# Patient Record
Sex: Female | Born: 1935 | Race: White | Hispanic: No | Smoking: Never smoker
Health system: Southern US, Community
[De-identification: ages and names within clinical notes are randomized; demographics above are authoritative.]

## PROBLEM LIST (undated history)

## (undated) DIAGNOSIS — I10 Essential (primary) hypertension: Secondary | ICD-10-CM

## (undated) HISTORY — PX: PACEMAKER INSERTION: SHX728

---

## 2014-07-31 ENCOUNTER — Encounter (HOSPITAL_BASED_OUTPATIENT_CLINIC_OR_DEPARTMENT_OTHER): Payer: Self-pay | Admitting: *Deleted

## 2014-07-31 ENCOUNTER — Emergency Department (HOSPITAL_BASED_OUTPATIENT_CLINIC_OR_DEPARTMENT_OTHER): Payer: Medicare Other

## 2014-07-31 ENCOUNTER — Emergency Department (HOSPITAL_BASED_OUTPATIENT_CLINIC_OR_DEPARTMENT_OTHER)
Admission: EM | Admit: 2014-07-31 | Discharge: 2014-07-31 | Disposition: A | Discharge status: Other Acute Inpatient Hospital | Payer: Medicare Other | Attending: Emergency Medicine | Admitting: Emergency Medicine

## 2014-07-31 DIAGNOSIS — I10 Essential (primary) hypertension: Secondary | ICD-10-CM | POA: Diagnosis not present

## 2014-07-31 DIAGNOSIS — R4182 Altered mental status, unspecified: Secondary | ICD-10-CM | POA: Diagnosis not present

## 2014-07-31 DIAGNOSIS — N390 Urinary tract infection, site not specified: Secondary | ICD-10-CM | POA: Insufficient documentation

## 2014-07-31 DIAGNOSIS — Z950 Presence of cardiac pacemaker: Secondary | ICD-10-CM | POA: Diagnosis not present

## 2014-07-31 DIAGNOSIS — R531 Weakness: Secondary | ICD-10-CM | POA: Diagnosis present

## 2014-07-31 DIAGNOSIS — Z880 Allergy status to penicillin: Secondary | ICD-10-CM | POA: Insufficient documentation

## 2014-07-31 HISTORY — DX: Essential (primary) hypertension: I10

## 2014-07-31 LAB — CBC WITH DIFFERENTIAL/PLATELET
BASOS ABS: 0 10*3/uL (ref 0.0–0.1)
BLASTS: 0 %
Band Neutrophils: 4 % (ref 0–10)
Basophils Relative: 0 % (ref 0–1)
EOS ABS: 0 10*3/uL (ref 0.0–0.7)
EOS PCT: 0 % (ref 0–5)
HCT: 40.1 % (ref 36.0–46.0)
Hemoglobin: 13.5 g/dL (ref 12.0–15.0)
LYMPHS PCT: 10 % — AB (ref 12–46)
Lymphs Abs: 1.6 10*3/uL (ref 0.7–4.0)
MCH: 27.6 pg (ref 26.0–34.0)
MCHC: 33.7 g/dL (ref 30.0–36.0)
MCV: 81.8 fL (ref 78.0–100.0)
METAMYELOCYTES PCT: 0 %
MONO ABS: 1.4 10*3/uL — AB (ref 0.1–1.0)
Monocytes Relative: 9 % (ref 3–12)
Myelocytes: 0 %
NEUTROS ABS: 12.7 10*3/uL — AB (ref 1.7–7.7)
Neutrophils Relative %: 77 % (ref 43–77)
PLATELETS: 335 10*3/uL (ref 150–400)
Promyelocytes Absolute: 0 %
RBC: 4.9 MIL/uL (ref 3.87–5.11)
RDW: 16.6 % — ABNORMAL HIGH (ref 11.5–15.5)
WBC: 15.7 10*3/uL — ABNORMAL HIGH (ref 4.0–10.5)
nRBC: 0 /100 WBC

## 2014-07-31 LAB — URINE MICROSCOPIC-ADD ON

## 2014-07-31 LAB — COMPREHENSIVE METABOLIC PANEL
ALBUMIN: 3.7 g/dL (ref 3.5–5.2)
ALT: 13 U/L (ref 0–35)
AST: 26 U/L (ref 0–37)
Alkaline Phosphatase: 109 U/L (ref 39–117)
Anion gap: 13 (ref 5–15)
BILIRUBIN TOTAL: 0.7 mg/dL (ref 0.3–1.2)
BUN: 58 mg/dL — ABNORMAL HIGH (ref 6–23)
CO2: 23 mmol/L (ref 19–32)
CREATININE: 3.58 mg/dL — AB (ref 0.50–1.10)
Calcium: 10 mg/dL (ref 8.4–10.5)
Chloride: 97 mEq/L (ref 96–112)
GFR calc Af Amer: 12 mL/min — ABNORMAL LOW (ref >90)
GFR calc non Af Amer: 10 mL/min — ABNORMAL LOW (ref >90)
Glucose, Bld: 108 mg/dL — ABNORMAL HIGH (ref 70–99)
Potassium: 3.9 mmol/L (ref 3.5–5.1)
Sodium: 133 mmol/L — ABNORMAL LOW (ref 135–145)
Total Protein: 8.4 g/dL — ABNORMAL HIGH (ref 6.0–8.3)

## 2014-07-31 LAB — URINALYSIS, ROUTINE W REFLEX MICROSCOPIC
GLUCOSE, UA: NEGATIVE mg/dL
Ketones, ur: 15 mg/dL — AB
Nitrite: NEGATIVE
Protein, ur: 100 mg/dL — AB
Specific Gravity, Urine: 1.014 (ref 1.005–1.030)
Urobilinogen, UA: 1 mg/dL (ref 0.0–1.0)
pH: 6 (ref 5.0–8.0)

## 2014-07-31 LAB — TROPONIN I: TROPONIN I: 0.05 ng/mL — AB (ref <0.031)

## 2014-07-31 LAB — CBG MONITORING, ED: Glucose-Capillary: 89 mg/dL (ref 70–99)

## 2014-07-31 MED ORDER — CIPROFLOXACIN IN D5W 400 MG/200ML IV SOLN
400.0000 mg | Freq: Once | INTRAVENOUS | Status: AC
  Administered 2014-07-31: 400 mg via INTRAVENOUS
  Filled 2014-07-31: qty 200

## 2014-07-31 NOTE — ED Notes (Signed)
Patient transported to CT 

## 2014-07-31 NOTE — ED Provider Notes (Signed)
CSN: [id number]     Arrival date & time 07/31/14  1817 History  This chart was scribed for Vida Roller, MD by Evon Slack, ED Scribe. This patient was seen in room MHT14/MHT14 and the patient's care was started at [facility].      Chief Complaint  Patient presents with  . Weakness   Patient is a 81 y.o. female presenting with weakness. The history is provided by a relative. No language interpreter was used.  Weakness    Level 5 Caveat: Altered mental Status  HPI Comments: Indyah Saulnier is a 81 y.o. female brought in by ambulance, who presents to the Emergency Department complaining of progressively worsening weakness onset 3 days prior. Son states she is in Geophysicist/field seismologist care living. Son states that she fell 4 days ago. States she has become "listless." States she has been progressively declining in her activities of daily living such as walking and interacting with others.    Past Medical History  Diagnosis Date  . Hypertension    Past Surgical History  Procedure Laterality Date  . Pacemaker insertion     No family history on file. History  Substance Use Topics  . Smoking status: Never Smoker   . Smokeless tobacco: Not on file  . Alcohol Use: No   OB History    No data available     Review of Systems  Unable to perform ROS: Mental status change  Neurological: Positive for weakness.      Allergies  Penicillins  Home Medications   Prior to Admission medications   Not on File   BP 107/65 mmHg  Pulse 72  Temp(Src) 98.1 F (36.7 C) (Oral)  Resp 24  SpO2 96%   Physical Exam  Constitutional: She appears well-developed and well-nourished. No distress.  HENT:  Head: Normocephalic and atraumatic.  Mouth/Throat: Oropharynx is clear and moist. No oropharyngeal exudate.  Eyes: Conjunctivae and EOM are normal. Pupils are equal, round, and reactive to light. Right eye exhibits no discharge. Left eye exhibits no discharge. No scleral icterus.  Neck: Normal range of  motion. Neck supple. No JVD present. No thyromegaly present.  Cardiovascular: Normal rate, regular rhythm, normal heart sounds and intact distal pulses.  Exam reveals no gallop and no friction rub.   No murmur heard. Pulmonary/Chest: Effort normal and breath sounds normal. No respiratory distress. She has no wheezes. She has no rales.  Abdominal: Soft. Bowel sounds are normal. She exhibits no distension and no mass. There is no tenderness.  Musculoskeletal: Normal range of motion. She exhibits no edema or tenderness.  Lymphadenopathy:    She has no cervical adenopathy.  Neurological: She is alert. Coordination normal.  The patient is able to grip bilaterally though it is weak, she is able to talk but answers questions inappropriately, she is unable to lift her legs off the bed bilaterally. There is no obvious facial droop, she is somnolent but arousable to voice  Skin: Skin is warm and dry. No rash noted. No erythema.  Psychiatric: She has a normal mood and affect. Her behavior is normal.  Nursing note and vitals reviewed.   ED Course  Procedures (including critical care time) DIAGNOSTIC STUDIES: Oxygen Saturation is 94% on RA, adequate by my interpretation.    COORDINATION OF CARE:    Labs Review Labs Reviewed  CBC WITH DIFFERENTIAL - Abnormal; Notable for the following:    WBC 15.7 (*)    RDW 16.6 (*)    Lymphocytes Relative 10 (*)  Neutro Abs 12.7 (*)    Monocytes Absolute 1.4 (*)    All other components within normal limits  COMPREHENSIVE METABOLIC PANEL - Abnormal; Notable for the following:    Sodium 133 (*)    Glucose, Bld 108 (*)    BUN 58 (*)    Creatinine, Ser 3.58 (*)    Total Protein 8.4 (*)    GFR calc non Af Amer 10 (*)    GFR calc Af Amer 12 (*)    All other components within normal limits  URINALYSIS, ROUTINE W REFLEX MICROSCOPIC - Abnormal; Notable for the following:    APPearance CLOUDY (*)    Hgb urine dipstick LARGE (*)    Bilirubin Urine SMALL (*)     Ketones, ur 15 (*)    Protein, ur 100 (*)    Leukocytes, UA MODERATE (*)    All other components within normal limits  TROPONIN I - Abnormal; Notable for the following:    Troponin I 0.05 (*)    All other components within normal limits  URINE MICROSCOPIC-ADD ON - Abnormal; Notable for the following:    Bacteria, UA MANY (*)    All other components within normal limits  CBG MONITORING, ED    Imaging Review Ct Head Wo Contrast  07/31/2014   CLINICAL DATA:  LEFT-sided weakness for 3 days. Fell 4 days ago. LEFT facial droop.  EXAM: CT HEAD WITHOUT CONTRAST  TECHNIQUE: Contiguous axial images were obtained from the base of the skull through the vertex without intravenous contrast.  COMPARISON:  CT head 08/02/2013.  CT head 09/28/2012.  FINDINGS: There is moderately advanced cerebral and cerebellar atrophy; this appears disproportionate in the temporal lobes. I believe this accounts for the appearance questioned on the previous scan of arachnoid cyst in the middle cranial fossa. There is hypoattenuation of the white matter consistent with chronic microvascular ischemic change. No visible acute stroke, intracranial hemorrhage, mass lesion, hydrocephalus, or extra-axial fluid. Calvarium intact. No acute sinus or mastoid disease. Advanced vascular calcification. Chronic nasal bone deformity, likely previous fracture. Similar appearance to priors.  IMPRESSION: No acute intracranial findings.  Chronic changes as described.   Electronically Signed   By: Davonna Belling M.D.   On: 07/31/2014 19:37   Dg Chest Port 1 View  07/31/2014   CLINICAL DATA:  Progressive weakness over the past 3 days. Fell 4 days ago.  EXAM: PORTABLE CHEST - 1 VIEW  COMPARISON:  09/28/2012  FINDINGS: Bilateral interstitial thickening and prominence of the central pulmonary vasculature. Trace right pleural effusion. No left pleural effusion. Mild stable cardiomegaly. Dual lead cardiac pacer.  Bilateral shoulder arthroplasties.   IMPRESSION: Mild cardiomegaly and pulmonary vascular congestion.   Electronically Signed   By: Elige Ko   On: 07/31/2014 20:42     EKG Interpretation   Date/Time:  Sunday July 31 2014 18:48:48 EST Ventricular Rate:  81 PR Interval:  234 QRS Duration: 92 QT Interval:  322 QTC Calculation: 374 R Axis:   -43 Text Interpretation:  Atrial-paced rhythm with prolonged AV conduction  Left axis deviation Anteroseptal infarct , age undetermined Abnormal ECG  No old tracing to compare Confirmed by Korinne Greenstein  MD, Tomie Elko ([id number]) on  07/31/2014 6:54:14 PM      MDM   Final diagnoses:  Altered mental state  UTI (lower urinary tract infection)   I discussed the care with the son who accompanies his mother and states that she has had a gradual decline over the last several days, there  was a fall several days ago, this was not evaluated at a medical facility, she has not been ambulatory today, she has not been getting out of bed, she has not been talking very much and has not had very much to eat or drink. This appears to be a gradual decline, the patient has no other complaints, she is difficult to arouse, she is not able to answer questions appropriately. This is all new.  The patient has remained with an altered mental status, she is somnolent, she has not had a decrease in this state, her vital signs show mild tachypnea but she is not hypoxic and does not have a pneumonia, CT scan does not show acute stroke mass or any subdural hemorrhage, urinalysis confirms urinary tract infection, labs otherwise show renal failure, son states this is not terribly new but he is unsure of the baseline creatinine.  Due to the patient's decreased mental status she will need to be admitted to the hospital, this was discussed with [facility], - Dr. Pilar GrammesHounnou  Meds given in ED:  Medications  ciprofloxacin (CIPRO) IVPB 400 mg (400 mg Intravenous New Bag/Given  07/31/14 2145)    New Prescriptions   No medications on file    I personally performed the services described in this documentation, which was scribed in my presence. The recorded information has been reviewed and is accurate.      Vida RollerBrian D Jasraj Lappe, MD 07/31/14 2245

## 2014-07-31 NOTE — ED Notes (Signed)
At bedside with Arline Aspindy, EMT, attempted to obtain In and Out Cath specimen - unsuccessful - EDP notified.

## 2014-07-31 NOTE — ED Notes (Signed)
Three days ago pt fell, progressively gotten more weak. She is in triage barely able to sit up in the wheelchair, follows commands, oriented x 3

## 2015-07-01 IMAGING — CR DG CHEST 1V PORT
1 series · 1 of 1 positions shown · non-contrast
Comparison: 09/28/2012

CLINICAL DATA: Progressive weakness over the past 3 days. Fell 4
days ago.

EXAM:
PORTABLE CHEST - 1 VIEW

[view not recorded]
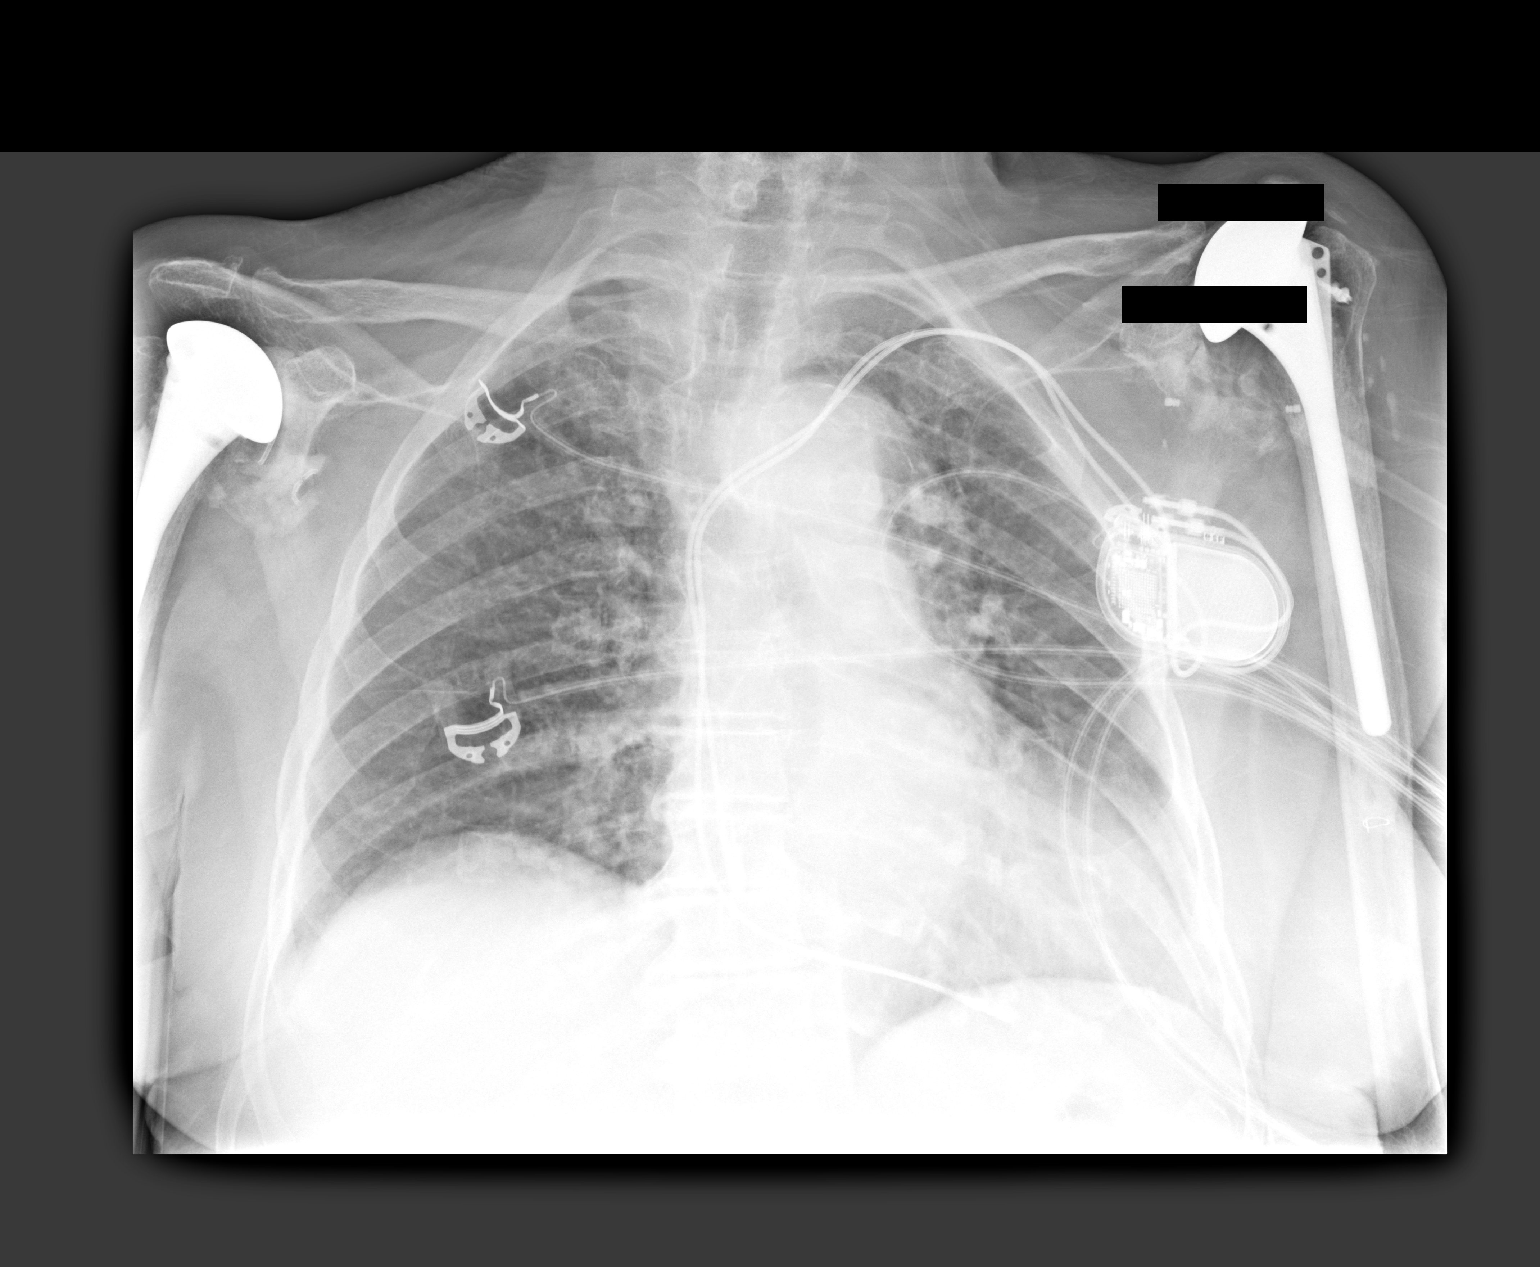

[1 of 1 positions shown; findings below may reference images not displayed]

FINDINGS: Bilateral interstitial thickening and prominence of the central
pulmonary vasculature. Trace right pleural effusion. No left pleural
effusion. Mild stable cardiomegaly. Dual lead cardiac pacer.

Bilateral shoulder arthroplasties.
IMPRESSION: Mild cardiomegaly and pulmonary vascular congestion.

## 2015-07-01 IMAGING — CT CT HEAD W/O CM
2 series · 16 of 30 positions shown, 18 images · non-contrast
Comparison: CT head 08/02/2013.  CT head 09/28/2012.

CLINICAL DATA: LEFT-sided weakness for 3 days. Fell 4 days ago.
LEFT facial droop.

EXAM:
CT HEAD WITHOUT CONTRAST
TECHNIQUE: Contiguous axial images were obtained from the base of the skull
through the vertex without intravenous contrast.

[Series 2: head 4.8 h37s · axial · 0.46mm/px · z∈[-120,+0]mm · 8 of 32 slices shown, 10 images]
[im 4/32  brain]
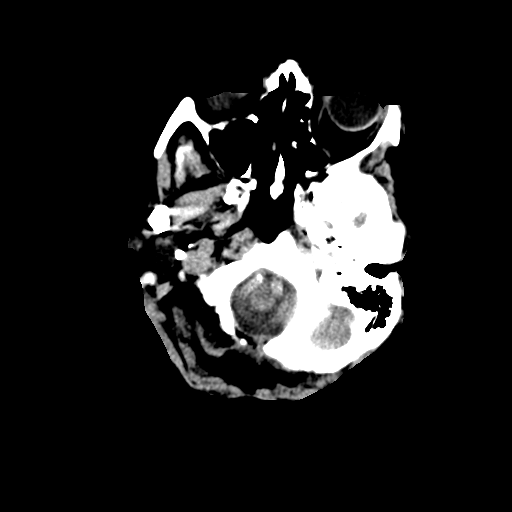
[im 4/32  bone]
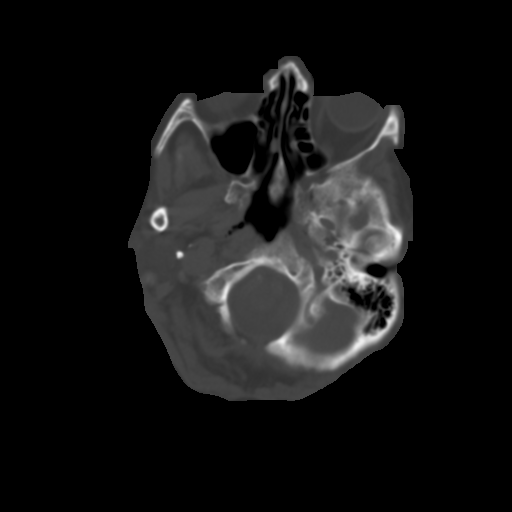
[im 7/32  brain]
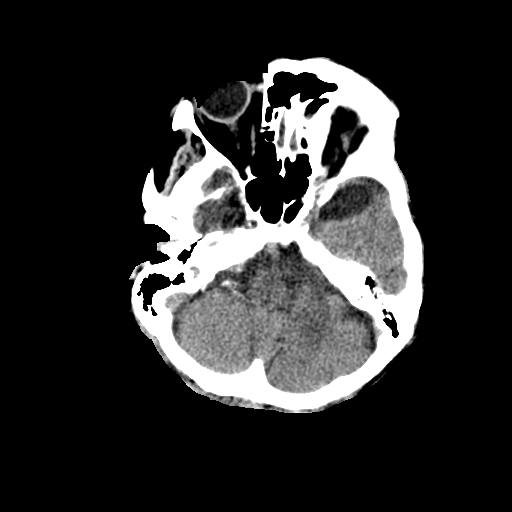
[im 11/32  brain]
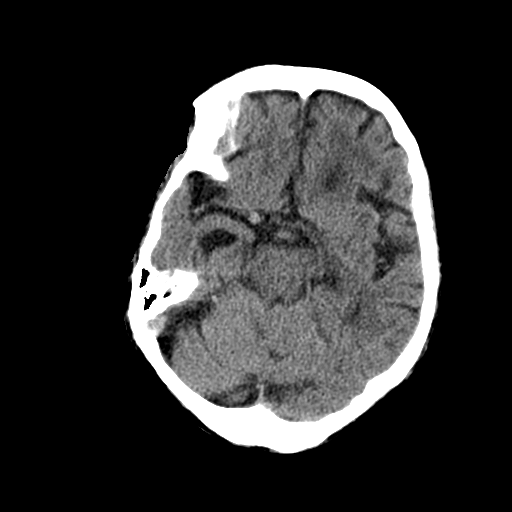
[im 14/32  brain]
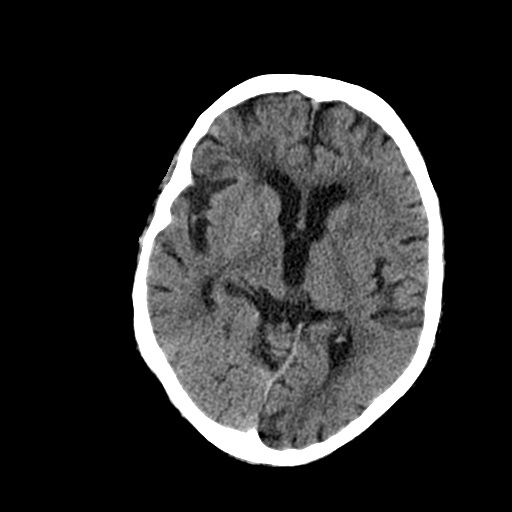
[im 18/32  brain]
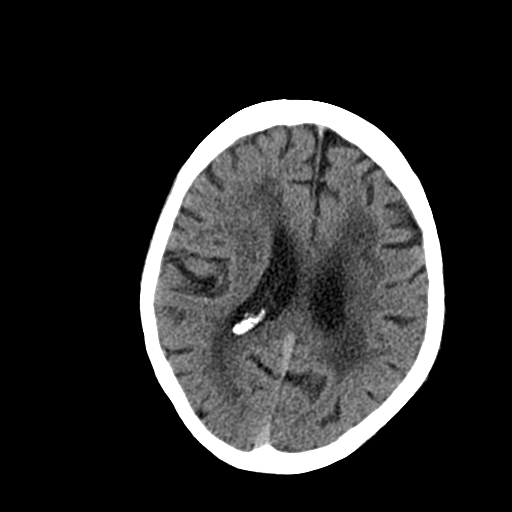
[im 18/32  bone]
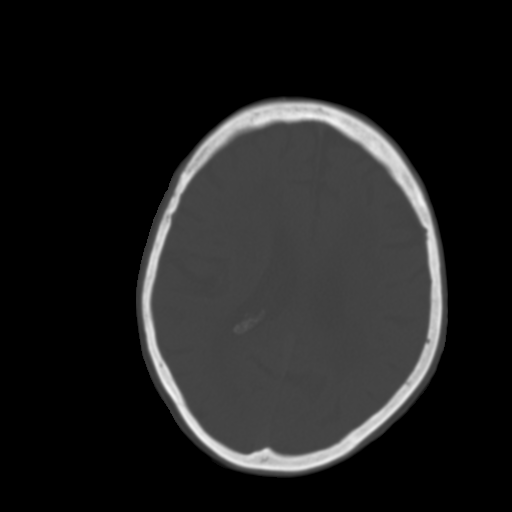
[im 21/32  brain]
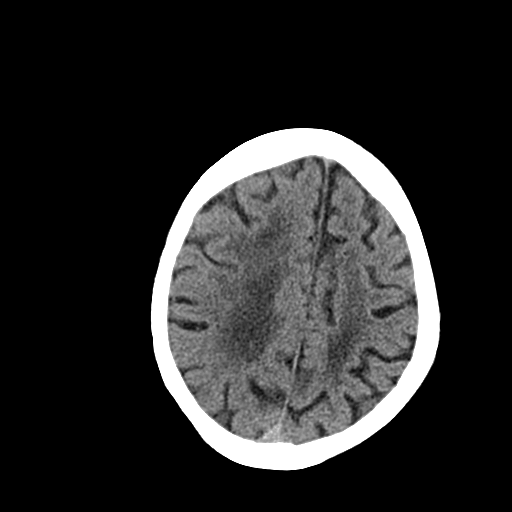
[im 25/32  brain]
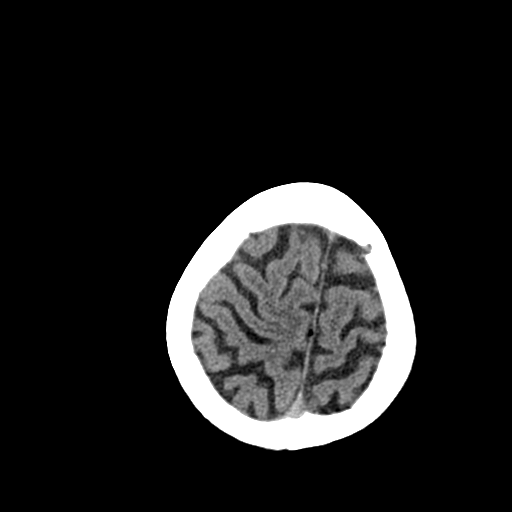
[im 28/32  brain]
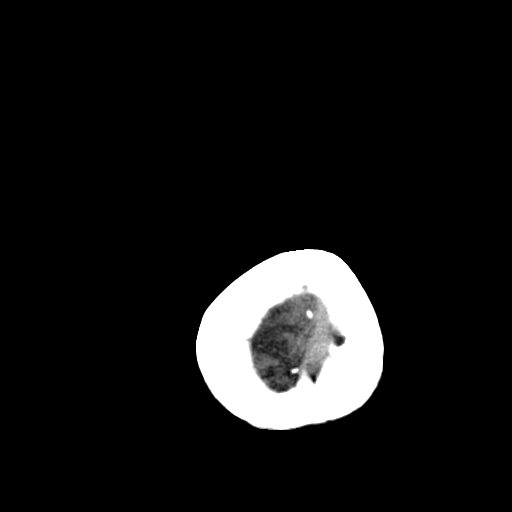

[Series 3: head 2.4 h60s bone · axial · 0.46mm/px · z∈[-121,+4]mm · 8 of 64 slices shown]
[im 7/64  bone]
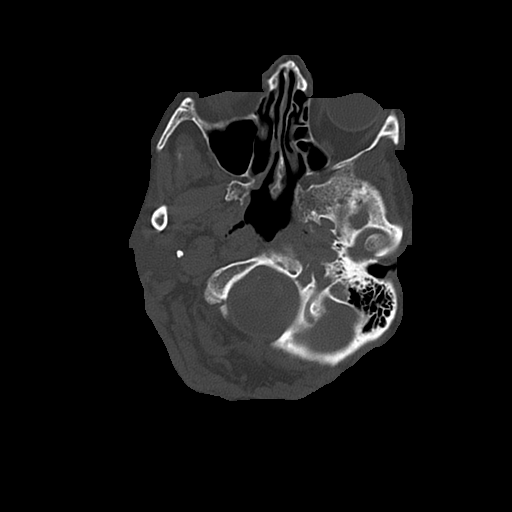
[im 14/64  bone]
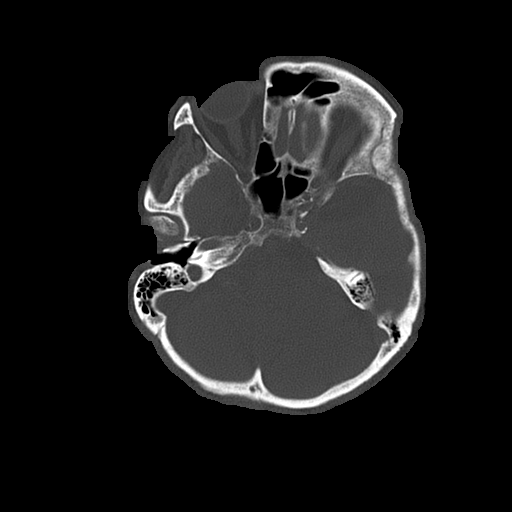
[im 20/64  bone]
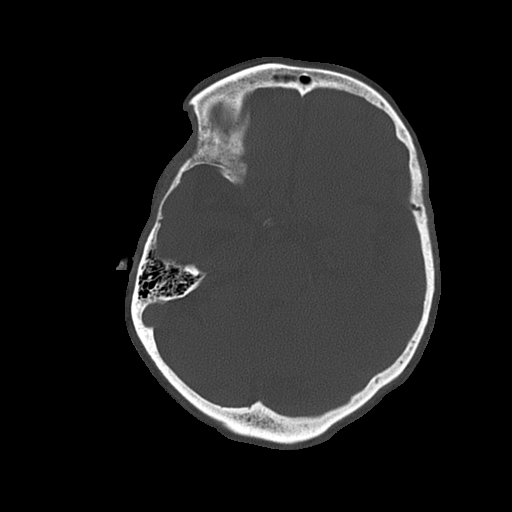
[im 27/64  bone]
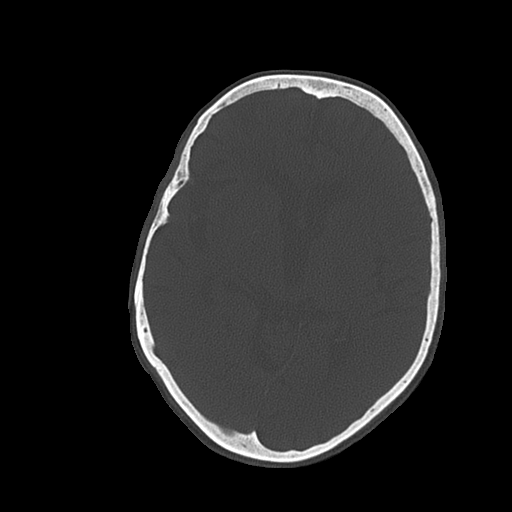
[im 37/64  bone]
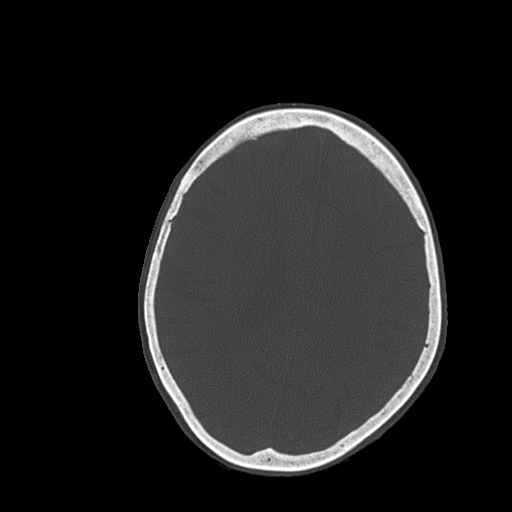
[im 44/64  bone]
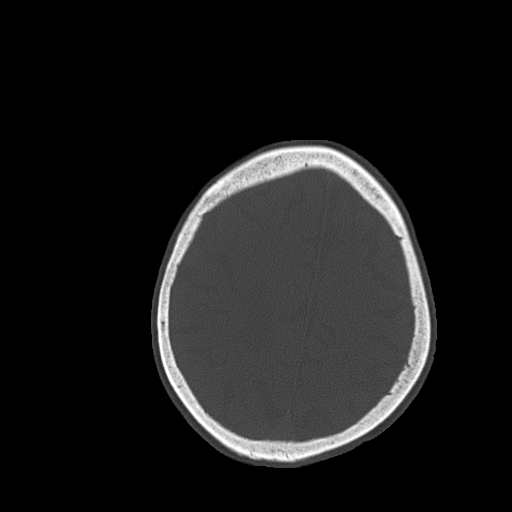
[im 50/64  bone]
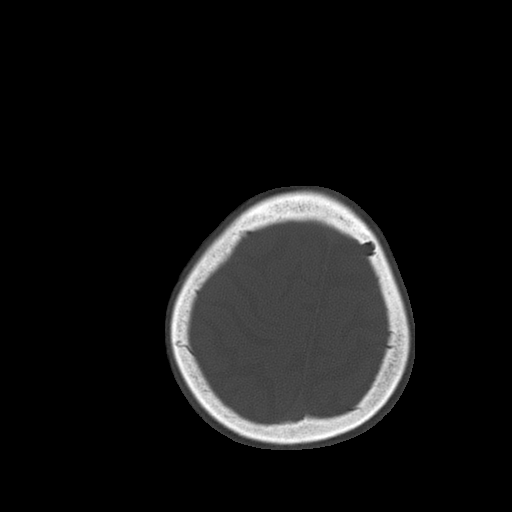
[im 57/64  bone]
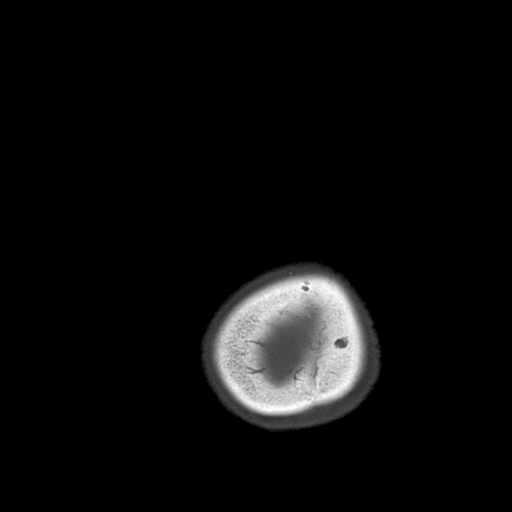

[16 of 30 positions shown; findings below may reference images not displayed]

FINDINGS: There is moderately advanced cerebral and cerebellar atrophy; this
appears disproportionate in the temporal lobes. I believe this
accounts for the appearance questioned on the previous scan of
arachnoid cyst in the middle cranial fossa. There is hypoattenuation
of the white matter consistent with chronic microvascular ischemic
change. No visible acute stroke, intracranial hemorrhage, mass
lesion, hydrocephalus, or extra-axial fluid. Calvarium intact. No
acute sinus or mastoid disease. Advanced vascular calcification.
Chronic nasal bone deformity, likely previous fracture. Similar
appearance to priors.
IMPRESSION: No acute intracranial findings.  Chronic changes as described.

## 2017-03-22 DEATH — deceased
